# Patient Record
Sex: Female | Born: 1980
Health system: Southern US, Community
[De-identification: ages and names within clinical notes are randomized; demographics above are authoritative.]

## PROBLEM LIST (undated history)

## (undated) DIAGNOSIS — R87629 Unspecified abnormal cytological findings in specimens from vagina: Secondary | ICD-10-CM

## (undated) DIAGNOSIS — R519 Headache, unspecified: Secondary | ICD-10-CM

## (undated) DIAGNOSIS — A749 Chlamydial infection, unspecified: Secondary | ICD-10-CM

## (undated) DIAGNOSIS — J40 Bronchitis, not specified as acute or chronic: Secondary | ICD-10-CM

## (undated) DIAGNOSIS — J45909 Unspecified asthma, uncomplicated: Secondary | ICD-10-CM

## (undated) DIAGNOSIS — A549 Gonococcal infection, unspecified: Secondary | ICD-10-CM

## (undated) HISTORY — DX: Unspecified asthma, uncomplicated: J45.909

## (undated) HISTORY — DX: Headache, unspecified: R51.9

## (undated) HISTORY — DX: Bronchitis, not specified as acute or chronic: J40

## (undated) HISTORY — DX: Chlamydial infection, unspecified: A74.9

## (undated) HISTORY — DX: Unspecified abnormal cytological findings in specimens from vagina: R87.629

## (undated) HISTORY — DX: Gonococcal infection, unspecified: A54.9

## (undated) HISTORY — PX: OTHER SURGICAL HISTORY: SHX169

---

## 2005-09-14 ENCOUNTER — Other Ambulatory Visit: Admission: RE | Admit: 2005-09-14 | Discharge: 2005-09-14 | Payer: Self-pay | Admitting: Family Medicine

## 2005-09-28 ENCOUNTER — Encounter: Admission: RE | Admit: 2005-09-28 | Discharge: 2005-09-28 | Payer: Self-pay | Admitting: Family Medicine

## 2006-10-01 ENCOUNTER — Other Ambulatory Visit: Admission: RE | Admit: 2006-10-01 | Discharge: 2006-10-01 | Payer: Self-pay | Admitting: Family Medicine

## 2007-10-31 ENCOUNTER — Other Ambulatory Visit: Admission: RE | Admit: 2007-10-31 | Discharge: 2007-10-31 | Payer: Self-pay | Admitting: Family Medicine

## 2008-11-01 ENCOUNTER — Other Ambulatory Visit: Admission: RE | Admit: 2008-11-01 | Discharge: 2008-11-01 | Payer: Self-pay | Admitting: Family Medicine

## 2009-10-24 ENCOUNTER — Emergency Department (HOSPITAL_COMMUNITY)
Admission: EM | Admit: 2009-10-24 | Discharge: 2009-10-24 | Payer: Self-pay | Source: Home / Self Care | Admitting: Emergency Medicine

## 2009-11-22 ENCOUNTER — Other Ambulatory Visit: Admission: RE | Admit: 2009-11-22 | Discharge: 2009-11-22 | Payer: Self-pay | Admitting: Family Medicine
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
## 2010-12-25 ENCOUNTER — Other Ambulatory Visit: Payer: Self-pay | Admitting: Family Medicine

## 2010-12-25 ENCOUNTER — Other Ambulatory Visit (HOSPITAL_COMMUNITY)
Admission: RE | Admit: 2010-12-25 | Discharge: 2010-12-25 | Disposition: A | Discharge status: Home / Self Care | Payer: PRIVATE HEALTH INSURANCE | Source: Ambulatory Visit | Attending: Family Medicine | Admitting: Family Medicine

## 2010-12-25 DIAGNOSIS — Z124 Encounter for screening for malignant neoplasm of cervix: Secondary | ICD-10-CM | POA: Insufficient documentation

## 2011-01-04 LAB — DIFFERENTIAL
Eosinophils Absolute: 0.6 10*3/uL (ref 0.0–0.7)
Eosinophils Relative: 5 % (ref 0–5)
Lymphs Abs: 5.1 10*3/uL — ABNORMAL HIGH (ref 0.7–4.0)
Monocytes Relative: 7 % (ref 3–12)

## 2011-01-04 LAB — BASIC METABOLIC PANEL
BUN: 11 mg/dL (ref 6–23)
CO2: 23 mEq/L (ref 19–32)
Chloride: 106 mEq/L (ref 96–112)
Potassium: 3.3 mEq/L — ABNORMAL LOW (ref 3.5–5.1)

## 2011-01-04 LAB — CBC
HCT: 43 % (ref 36.0–46.0)
MCV: 88.8 fL (ref 78.0–100.0)
Platelets: 349 10*3/uL (ref 150–400)
RBC: 4.84 MIL/uL (ref 3.87–5.11)
WBC: 11.5 10*3/uL — ABNORMAL HIGH (ref 4.0–10.5)

## 2012-12-28 ENCOUNTER — Other Ambulatory Visit: Payer: Self-pay | Admitting: Family Medicine

## 2012-12-28 ENCOUNTER — Other Ambulatory Visit (HOSPITAL_COMMUNITY)
Admission: RE | Admit: 2012-12-28 | Discharge: 2012-12-28 | Disposition: A | Discharge status: Home / Self Care | Payer: BC Managed Care – PPO | Source: Ambulatory Visit | Attending: Family Medicine | Admitting: Family Medicine

## 2012-12-28 DIAGNOSIS — Z0000 Encounter for general adult medical examination without abnormal findings: Secondary | ICD-10-CM | POA: Insufficient documentation

## 2013-12-29 ENCOUNTER — Other Ambulatory Visit (HOSPITAL_COMMUNITY)
Admission: RE | Admit: 2013-12-29 | Discharge: 2013-12-29 | Disposition: A | Discharge status: Home / Self Care | Payer: 59 | Source: Ambulatory Visit | Attending: Family Medicine | Admitting: Family Medicine

## 2013-12-29 ENCOUNTER — Other Ambulatory Visit: Payer: Self-pay | Admitting: Family Medicine

## 2013-12-29 DIAGNOSIS — Z0000 Encounter for general adult medical examination without abnormal findings: Secondary | ICD-10-CM | POA: Insufficient documentation

## 2013-12-29 DIAGNOSIS — Z1151 Encounter for screening for human papillomavirus (HPV): Secondary | ICD-10-CM | POA: Insufficient documentation

## 2014-03-28 ENCOUNTER — Other Ambulatory Visit: Payer: Self-pay | Admitting: Family Medicine

## 2017-01-04 DIAGNOSIS — D2261 Melanocytic nevi of right upper limb, including shoulder: Secondary | ICD-10-CM | POA: Diagnosis not present

## 2017-01-04 DIAGNOSIS — D485 Neoplasm of uncertain behavior of skin: Secondary | ICD-10-CM | POA: Diagnosis not present

## 2017-01-04 DIAGNOSIS — D2271 Melanocytic nevi of right lower limb, including hip: Secondary | ICD-10-CM | POA: Diagnosis not present

## 2017-01-04 DIAGNOSIS — D225 Melanocytic nevi of trunk: Secondary | ICD-10-CM | POA: Diagnosis not present

## 2017-01-04 DIAGNOSIS — L723 Sebaceous cyst: Secondary | ICD-10-CM | POA: Diagnosis not present

## 2017-01-04 DIAGNOSIS — D2262 Melanocytic nevi of left upper limb, including shoulder: Secondary | ICD-10-CM | POA: Diagnosis not present

## 2017-02-02 ENCOUNTER — Other Ambulatory Visit: Payer: Self-pay | Admitting: Family Medicine

## 2017-02-02 ENCOUNTER — Other Ambulatory Visit (HOSPITAL_COMMUNITY)
Admission: RE | Admit: 2017-02-02 | Discharge: 2017-02-02 | Disposition: A | Discharge status: Home / Self Care | Payer: BLUE CROSS/BLUE SHIELD | Source: Ambulatory Visit | Attending: Family Medicine | Admitting: Family Medicine

## 2017-02-02 DIAGNOSIS — Z113 Encounter for screening for infections with a predominantly sexual mode of transmission: Secondary | ICD-10-CM | POA: Insufficient documentation

## 2017-02-02 DIAGNOSIS — Z124 Encounter for screening for malignant neoplasm of cervix: Secondary | ICD-10-CM | POA: Diagnosis not present

## 2017-02-02 DIAGNOSIS — Z0000 Encounter for general adult medical examination without abnormal findings: Secondary | ICD-10-CM | POA: Diagnosis not present

## 2017-02-02 DIAGNOSIS — M25562 Pain in left knee: Secondary | ICD-10-CM | POA: Diagnosis not present

## 2017-02-02 DIAGNOSIS — J4520 Mild intermittent asthma, uncomplicated: Secondary | ICD-10-CM | POA: Diagnosis not present

## 2017-02-04 LAB — CYTOLOGY - PAP
ADEQUACY: ABSENT
CHLAMYDIA, DNA PROBE: NEGATIVE
Diagnosis: NEGATIVE
NEISSERIA GONORRHEA: NEGATIVE

## 2017-02-05 DIAGNOSIS — Z0000 Encounter for general adult medical examination without abnormal findings: Secondary | ICD-10-CM | POA: Diagnosis not present

## 2017-02-05 DIAGNOSIS — Z131 Encounter for screening for diabetes mellitus: Secondary | ICD-10-CM | POA: Diagnosis not present

## 2017-02-05 DIAGNOSIS — Z113 Encounter for screening for infections with a predominantly sexual mode of transmission: Secondary | ICD-10-CM | POA: Diagnosis not present

## 2017-05-05 DIAGNOSIS — L509 Urticaria, unspecified: Secondary | ICD-10-CM | POA: Diagnosis not present

## 2017-07-29 DIAGNOSIS — H66002 Acute suppurative otitis media without spontaneous rupture of ear drum, left ear: Secondary | ICD-10-CM | POA: Diagnosis not present

## 2017-07-29 DIAGNOSIS — J4521 Mild intermittent asthma with (acute) exacerbation: Secondary | ICD-10-CM | POA: Diagnosis not present

## 2018-01-10 DIAGNOSIS — D224 Melanocytic nevi of scalp and neck: Secondary | ICD-10-CM | POA: Diagnosis not present

## 2018-01-10 DIAGNOSIS — L812 Freckles: Secondary | ICD-10-CM | POA: Diagnosis not present

## 2018-01-10 DIAGNOSIS — L720 Epidermal cyst: Secondary | ICD-10-CM | POA: Diagnosis not present

## 2018-01-10 DIAGNOSIS — L7211 Pilar cyst: Secondary | ICD-10-CM | POA: Diagnosis not present

## 2018-02-15 DIAGNOSIS — Z23 Encounter for immunization: Secondary | ICD-10-CM | POA: Diagnosis not present

## 2018-02-15 DIAGNOSIS — N644 Mastodynia: Secondary | ICD-10-CM | POA: Diagnosis not present

## 2018-02-15 DIAGNOSIS — Z3169 Encounter for other general counseling and advice on procreation: Secondary | ICD-10-CM | POA: Diagnosis not present

## 2018-02-15 DIAGNOSIS — J4520 Mild intermittent asthma, uncomplicated: Secondary | ICD-10-CM | POA: Diagnosis not present

## 2018-02-15 DIAGNOSIS — Z0001 Encounter for general adult medical examination with abnormal findings: Secondary | ICD-10-CM | POA: Diagnosis not present

## 2018-02-15 DIAGNOSIS — F17210 Nicotine dependence, cigarettes, uncomplicated: Secondary | ICD-10-CM | POA: Diagnosis not present

## 2018-02-16 ENCOUNTER — Other Ambulatory Visit: Payer: Self-pay | Admitting: Family Medicine

## 2018-02-16 DIAGNOSIS — N644 Mastodynia: Secondary | ICD-10-CM

## 2018-02-22 ENCOUNTER — Ambulatory Visit
Admission: RE | Admit: 2018-02-22 | Discharge: 2018-02-22 | Disposition: A | Discharge status: Home / Self Care | Payer: PRIVATE HEALTH INSURANCE | Source: Ambulatory Visit | Attending: Family Medicine | Admitting: Family Medicine

## 2018-02-22 ENCOUNTER — Ambulatory Visit
Admission: RE | Admit: 2018-02-22 | Discharge: 2018-02-22 | Disposition: A | Discharge status: Home / Self Care | Payer: BLUE CROSS/BLUE SHIELD | Source: Ambulatory Visit | Attending: Family Medicine | Admitting: Family Medicine

## 2018-02-22 DIAGNOSIS — N644 Mastodynia: Secondary | ICD-10-CM

## 2018-02-22 DIAGNOSIS — N6012 Diffuse cystic mastopathy of left breast: Secondary | ICD-10-CM | POA: Diagnosis not present

## 2018-02-22 DIAGNOSIS — R922 Inconclusive mammogram: Secondary | ICD-10-CM | POA: Diagnosis not present

## 2018-07-25 DIAGNOSIS — Z6826 Body mass index (BMI) 26.0-26.9, adult: Secondary | ICD-10-CM | POA: Diagnosis not present

## 2018-07-25 DIAGNOSIS — L247 Irritant contact dermatitis due to plants, except food: Secondary | ICD-10-CM | POA: Diagnosis not present

## 2019-03-22 DIAGNOSIS — D224 Melanocytic nevi of scalp and neck: Secondary | ICD-10-CM | POA: Diagnosis not present

## 2019-03-22 DIAGNOSIS — D2261 Melanocytic nevi of right upper limb, including shoulder: Secondary | ICD-10-CM | POA: Diagnosis not present

## 2019-03-22 DIAGNOSIS — D2362 Other benign neoplasm of skin of left upper limb, including shoulder: Secondary | ICD-10-CM | POA: Diagnosis not present

## 2019-03-22 DIAGNOSIS — D2361 Other benign neoplasm of skin of right upper limb, including shoulder: Secondary | ICD-10-CM | POA: Diagnosis not present

## 2019-03-27 DIAGNOSIS — F17210 Nicotine dependence, cigarettes, uncomplicated: Secondary | ICD-10-CM | POA: Diagnosis not present

## 2019-03-27 DIAGNOSIS — Z309 Encounter for contraceptive management, unspecified: Secondary | ICD-10-CM | POA: Diagnosis not present

## 2019-03-27 DIAGNOSIS — Z0000 Encounter for general adult medical examination without abnormal findings: Secondary | ICD-10-CM | POA: Diagnosis not present

## 2019-03-27 DIAGNOSIS — J4520 Mild intermittent asthma, uncomplicated: Secondary | ICD-10-CM | POA: Diagnosis not present

## 2019-03-27 DIAGNOSIS — J309 Allergic rhinitis, unspecified: Secondary | ICD-10-CM | POA: Diagnosis not present

## 2020-01-09 DIAGNOSIS — N912 Amenorrhea, unspecified: Secondary | ICD-10-CM | POA: Diagnosis not present

## 2020-04-02 ENCOUNTER — Other Ambulatory Visit (HOSPITAL_COMMUNITY)
Admission: RE | Admit: 2020-04-02 | Discharge: 2020-04-02 | Disposition: A | Discharge status: Home / Self Care | Payer: BC Managed Care – PPO | Source: Ambulatory Visit | Attending: Family Medicine | Admitting: Family Medicine

## 2020-04-02 ENCOUNTER — Other Ambulatory Visit: Payer: Self-pay | Admitting: Family Medicine

## 2020-04-02 DIAGNOSIS — Z124 Encounter for screening for malignant neoplasm of cervix: Secondary | ICD-10-CM | POA: Insufficient documentation

## 2020-04-02 DIAGNOSIS — Z3169 Encounter for other general counseling and advice on procreation: Secondary | ICD-10-CM | POA: Diagnosis not present

## 2020-04-02 DIAGNOSIS — F17210 Nicotine dependence, cigarettes, uncomplicated: Secondary | ICD-10-CM | POA: Diagnosis not present

## 2020-04-02 DIAGNOSIS — Z130 Encounter for screening for diseases of the blood and blood-forming organs and certain disorders involving the immune mechanism: Secondary | ICD-10-CM | POA: Diagnosis not present

## 2020-04-02 DIAGNOSIS — J309 Allergic rhinitis, unspecified: Secondary | ICD-10-CM | POA: Diagnosis not present

## 2020-04-02 DIAGNOSIS — L247 Irritant contact dermatitis due to plants, except food: Secondary | ICD-10-CM | POA: Diagnosis not present

## 2020-04-02 DIAGNOSIS — D2262 Melanocytic nevi of left upper limb, including shoulder: Secondary | ICD-10-CM | POA: Diagnosis not present

## 2020-04-02 DIAGNOSIS — B360 Pityriasis versicolor: Secondary | ICD-10-CM | POA: Diagnosis not present

## 2020-04-02 DIAGNOSIS — Z0000 Encounter for general adult medical examination without abnormal findings: Secondary | ICD-10-CM | POA: Diagnosis not present

## 2020-04-02 DIAGNOSIS — D225 Melanocytic nevi of trunk: Secondary | ICD-10-CM | POA: Diagnosis not present

## 2020-04-02 DIAGNOSIS — D2261 Melanocytic nevi of right upper limb, including shoulder: Secondary | ICD-10-CM | POA: Diagnosis not present

## 2020-04-02 DIAGNOSIS — J4520 Mild intermittent asthma, uncomplicated: Secondary | ICD-10-CM | POA: Diagnosis not present

## 2020-04-08 LAB — CYTOLOGY - PAP
Comment: NEGATIVE
Diagnosis: NEGATIVE
High risk HPV: NEGATIVE

## 2020-07-02 DIAGNOSIS — N898 Other specified noninflammatory disorders of vagina: Secondary | ICD-10-CM | POA: Diagnosis not present

## 2020-07-02 DIAGNOSIS — Z3A01 Less than 8 weeks gestation of pregnancy: Secondary | ICD-10-CM | POA: Diagnosis not present

## 2020-07-03 DIAGNOSIS — O0281 Inappropriate change in quantitative human chorionic gonadotropin (hCG) in early pregnancy: Secondary | ICD-10-CM | POA: Diagnosis not present

## 2020-07-03 DIAGNOSIS — O209 Hemorrhage in early pregnancy, unspecified: Secondary | ICD-10-CM | POA: Diagnosis not present

## 2020-07-15 DIAGNOSIS — O0281 Inappropriate change in quantitative human chorionic gonadotropin (hCG) in early pregnancy: Secondary | ICD-10-CM | POA: Diagnosis not present

## 2020-09-24 DIAGNOSIS — O09521 Supervision of elderly multigravida, first trimester: Secondary | ICD-10-CM | POA: Diagnosis not present

## 2020-09-24 DIAGNOSIS — Z3201 Encounter for pregnancy test, result positive: Secondary | ICD-10-CM | POA: Diagnosis not present

## 2020-09-24 DIAGNOSIS — Z3480 Encounter for supervision of other normal pregnancy, unspecified trimester: Secondary | ICD-10-CM | POA: Diagnosis not present

## 2020-09-24 DIAGNOSIS — O09291 Supervision of pregnancy with other poor reproductive or obstetric history, first trimester: Secondary | ICD-10-CM | POA: Diagnosis not present

## 2020-10-07 DIAGNOSIS — Z3490 Encounter for supervision of normal pregnancy, unspecified, unspecified trimester: Secondary | ICD-10-CM | POA: Diagnosis not present

## 2020-10-19 NOTE — L&D Delivery Note (Signed)
Delivery Note Arrived to room to evaluate patient status. Cervix completely dilated with Twin A in vaginal canal. Counseled patient regarding indication to initiate pushing. Twin A delivered in cephalic position and fetal movement and attempted breathing noted. Cord was clamped and cut and placed on maternal abdomen and then chest for skin-to-skin. Twin B delivered en caul and fetal movement was noted. Cord was clamped and cut and placed on maternal chest for skin-to-skin. Pitocin bolus started. With a maternal push, the placenta delivered intact. The cervix, vagina, labia and perineum were inspected and no lacerations were found. Fundus firm and below umbilicus. All counts were correct.   Anesthesia: Epidural Episiotomy:  N/A Lacerations:  None Suture Repair:  None Est. Blood Loss (mL):  400  Steva Ready 07/30/2021, 7:49 AM

## 2020-10-29 DIAGNOSIS — O039 Complete or unspecified spontaneous abortion without complication: Secondary | ICD-10-CM | POA: Diagnosis not present

## 2021-03-26 DIAGNOSIS — Z79899 Other long term (current) drug therapy: Secondary | ICD-10-CM | POA: Diagnosis not present

## 2021-03-26 DIAGNOSIS — F17200 Nicotine dependence, unspecified, uncomplicated: Secondary | ICD-10-CM | POA: Diagnosis not present

## 2021-03-26 DIAGNOSIS — R112 Nausea with vomiting, unspecified: Secondary | ICD-10-CM | POA: Diagnosis not present

## 2021-03-26 DIAGNOSIS — O26891 Other specified pregnancy related conditions, first trimester: Secondary | ICD-10-CM | POA: Diagnosis not present

## 2021-03-26 DIAGNOSIS — Z331 Pregnant state, incidental: Secondary | ICD-10-CM | POA: Diagnosis not present

## 2021-03-26 DIAGNOSIS — R109 Unspecified abdominal pain: Secondary | ICD-10-CM | POA: Diagnosis not present

## 2021-03-26 DIAGNOSIS — Z3A01 Less than 8 weeks gestation of pregnancy: Secondary | ICD-10-CM | POA: Diagnosis not present

## 2021-04-01 DIAGNOSIS — O26899 Other specified pregnancy related conditions, unspecified trimester: Secondary | ICD-10-CM | POA: Diagnosis not present

## 2021-04-01 DIAGNOSIS — O09521 Supervision of elderly multigravida, first trimester: Secondary | ICD-10-CM | POA: Diagnosis not present

## 2021-04-01 DIAGNOSIS — O09291 Supervision of pregnancy with other poor reproductive or obstetric history, first trimester: Secondary | ICD-10-CM | POA: Diagnosis not present

## 2021-04-02 DIAGNOSIS — D2261 Melanocytic nevi of right upper limb, including shoulder: Secondary | ICD-10-CM | POA: Diagnosis not present

## 2021-04-02 DIAGNOSIS — D2262 Melanocytic nevi of left upper limb, including shoulder: Secondary | ICD-10-CM | POA: Diagnosis not present

## 2021-04-02 DIAGNOSIS — D224 Melanocytic nevi of scalp and neck: Secondary | ICD-10-CM | POA: Diagnosis not present

## 2021-04-02 DIAGNOSIS — D2362 Other benign neoplasm of skin of left upper limb, including shoulder: Secondary | ICD-10-CM | POA: Diagnosis not present

## 2021-04-03 DIAGNOSIS — O021 Missed abortion: Secondary | ICD-10-CM | POA: Diagnosis not present

## 2021-04-07 DIAGNOSIS — Z3480 Encounter for supervision of other normal pregnancy, unspecified trimester: Secondary | ICD-10-CM | POA: Diagnosis not present

## 2021-04-11 DIAGNOSIS — J4520 Mild intermittent asthma, uncomplicated: Secondary | ICD-10-CM | POA: Diagnosis not present

## 2021-04-11 DIAGNOSIS — L247 Irritant contact dermatitis due to plants, except food: Secondary | ICD-10-CM | POA: Diagnosis not present

## 2021-04-11 DIAGNOSIS — Z0000 Encounter for general adult medical examination without abnormal findings: Secondary | ICD-10-CM | POA: Diagnosis not present

## 2021-04-17 DIAGNOSIS — O09291 Supervision of pregnancy with other poor reproductive or obstetric history, first trimester: Secondary | ICD-10-CM | POA: Diagnosis not present

## 2021-04-17 DIAGNOSIS — O26899 Other specified pregnancy related conditions, unspecified trimester: Secondary | ICD-10-CM | POA: Diagnosis not present

## 2021-04-25 DIAGNOSIS — Z3480 Encounter for supervision of other normal pregnancy, unspecified trimester: Secondary | ICD-10-CM | POA: Diagnosis not present

## 2021-04-28 LAB — OB RESULTS CONSOLE GC/CHLAMYDIA
Chlamydia: NEGATIVE
Gonorrhea: NEGATIVE

## 2021-04-28 LAB — OB RESULTS CONSOLE RPR: RPR: NONREACTIVE

## 2021-04-28 LAB — OB RESULTS CONSOLE HIV ANTIBODY (ROUTINE TESTING): HIV: NONREACTIVE

## 2021-04-28 LAB — OB RESULTS CONSOLE RUBELLA ANTIBODY, IGM: Rubella: IMMUNE

## 2021-04-28 LAB — HEPATITIS C ANTIBODY: HCV Ab: NEGATIVE

## 2021-04-28 LAB — OB RESULTS CONSOLE HEPATITIS B SURFACE ANTIGEN: Hepatitis B Surface Ag: NEGATIVE

## 2021-05-01 DIAGNOSIS — Z3481 Encounter for supervision of other normal pregnancy, first trimester: Secondary | ICD-10-CM | POA: Diagnosis not present

## 2021-05-15 ENCOUNTER — Encounter: Payer: Self-pay | Admitting: Obstetrics and Gynecology

## 2021-05-15 DIAGNOSIS — O09521 Supervision of elderly multigravida, first trimester: Secondary | ICD-10-CM | POA: Diagnosis not present

## 2021-05-15 DIAGNOSIS — O30009 Twin pregnancy, unspecified number of placenta and unspecified number of amniotic sacs, unspecified trimester: Secondary | ICD-10-CM | POA: Diagnosis not present

## 2021-05-20 ENCOUNTER — Encounter: Payer: Self-pay | Admitting: *Deleted

## 2021-05-22 ENCOUNTER — Other Ambulatory Visit: Payer: Self-pay | Admitting: Obstetrics and Gynecology

## 2021-05-22 DIAGNOSIS — Z3A12 12 weeks gestation of pregnancy: Secondary | ICD-10-CM

## 2021-05-22 DIAGNOSIS — O30009 Twin pregnancy, unspecified number of placenta and unspecified number of amniotic sacs, unspecified trimester: Secondary | ICD-10-CM

## 2021-05-23 ENCOUNTER — Encounter: Payer: Self-pay | Admitting: *Deleted

## 2021-05-23 ENCOUNTER — Ambulatory Visit (HOSPITAL_BASED_OUTPATIENT_CLINIC_OR_DEPARTMENT_OTHER): Payer: BC Managed Care – PPO | Admitting: Obstetrics

## 2021-05-23 ENCOUNTER — Ambulatory Visit: Payer: BC Managed Care – PPO | Attending: Obstetrics and Gynecology | Admitting: *Deleted

## 2021-05-23 ENCOUNTER — Ambulatory Visit (HOSPITAL_BASED_OUTPATIENT_CLINIC_OR_DEPARTMENT_OTHER): Payer: BC Managed Care – PPO

## 2021-05-23 ENCOUNTER — Other Ambulatory Visit: Payer: Self-pay

## 2021-05-23 ENCOUNTER — Other Ambulatory Visit: Payer: Self-pay | Admitting: *Deleted

## 2021-05-23 ENCOUNTER — Ambulatory Visit: Payer: BC Managed Care – PPO

## 2021-05-23 ENCOUNTER — Other Ambulatory Visit: Payer: Self-pay | Admitting: Obstetrics and Gynecology

## 2021-05-23 VITALS — BP 118/56 | HR 91 | Ht 63.0 in

## 2021-05-23 DIAGNOSIS — O30009 Twin pregnancy, unspecified number of placenta and unspecified number of amniotic sacs, unspecified trimester: Secondary | ICD-10-CM | POA: Diagnosis not present

## 2021-05-23 DIAGNOSIS — O30031 Twin pregnancy, monochorionic/diamniotic, first trimester: Secondary | ICD-10-CM | POA: Diagnosis not present

## 2021-05-23 DIAGNOSIS — Z3A12 12 weeks gestation of pregnancy: Secondary | ICD-10-CM | POA: Insufficient documentation

## 2021-05-23 DIAGNOSIS — O09521 Supervision of elderly multigravida, first trimester: Secondary | ICD-10-CM | POA: Diagnosis not present

## 2021-05-23 DIAGNOSIS — O30039 Twin pregnancy, monochorionic/diamniotic, unspecified trimester: Secondary | ICD-10-CM

## 2021-05-23 NOTE — Progress Notes (Signed)
MFM Note  Deanna Pena was seen due to a spontaneously conceived twin pregnancy and advanced maternal age.  She denies any significant past medical history and denies any problems in her current pregnancy.  She has not had any screening tests for fetal aneuploidy drawn in her current pregnancy.  A thin dividing membrane was noted separating the two fetuses along with a single placenta, indicating that these are monochorionic, diamniotic twins.  This finding was confirmed using transvaginal ultrasound today.  The crown-rump lengths for both twin A and twin B measured appropriate for her gestational age.  There was normal amniotic fluid noted around both fetuses.  The nuchal translucency's for both fetuses appeared within normal limits.  The placenta appeared close to the internal cervical os today.  She was reassured that as she is very early in her pregnancy, the placenta will most likely move away from the cervix later in her pregnancy.  The implications and management of monochorionic twins was discussed. The 10% to 15% risk of twin to twin transfusion syndrome seen in monochorionic, diamniotic twins was discussed today.  The implications and management of twin to twin transfusion syndrome (TTTS) should she develop this complication was also discussed.  She was advised that we will continue to follow her closely with serial ultrasounds to assess for signs of TTTS.  She was advised that management of twin pregnancies will involve frequent ultrasound exams to assess the fetal growth and amniotic fluid level.  We will continue to follow her with ultrasounds once every 2 weeks to assess for signs of the twin to twin transfusion syndrome starting at 16 weeks. Weekly fetal testing should be started at around 32 weeks.  Delivery for uncomplicated monochorionic twins is recommended at around 37 weeks.  The increased risk of preeclampsia, gestational diabetes, and preterm birth/labor associated with twin  pregnancies was discussed.  She was advised that we will continue to follow her closely to assess for these conditions. As pregnancies with multiple gestations are at increased risk for developing preeclampsia, she was advised to start taking up to 2 tablets of baby aspirin (81 mg) per day to decrease her risk of developing preeclampsia as soon as possible.  There is more recent evidence to indicate that a higher dose of aspirin daily may be better at preventing preeclampsia in twins.  Due to advanced maternal age, the patient had the [employer] Panorama cell free DNA test drawn following today's ultrasound exam to screen for fetal aneuploidy.  The Panorama cell free DNA test will also help determine the zygosity of the twin gestation.  A follow-up ultrasound exam was scheduled in 4 weeks to screen for twin-twin transfusion syndrome.  A detailed fetal anatomy scan has also been scheduled in our office at around 18 to 19 weeks.  We will refer her to pediatric cardiology for a fetal echocardiogram later in her pregnancy.  The patient stated that all of her questions have been answered to her complete satisfaction.  A total of 35 minutes was spent counseling and coordinating the care for this patient.  Greater than 50% of the time was spent in direct face-to-face contact.  Recommendations:  Take up to 2 tablets of baby aspirin (81 mg) for preeclampsia prophylaxis Start ultrasounds once every 2 weeks at 16 weeks to screen for twin to twin transfusion syndrome Detailed fetal anatomy scan at 18 to 19 weeks Fetal echocardiogram at around 20 to 22 weeks Weekly fetal testing to be started at 32 weeks  Delivery by  37 weeks

## 2021-05-26 ENCOUNTER — Other Ambulatory Visit: Payer: Self-pay | Admitting: Obstetrics and Gynecology

## 2021-05-26 DIAGNOSIS — O30042 Twin pregnancy, dichorionic/diamniotic, second trimester: Secondary | ICD-10-CM

## 2021-05-26 DIAGNOSIS — Z3A19 19 weeks gestation of pregnancy: Secondary | ICD-10-CM

## 2021-05-26 DIAGNOSIS — Z363 Encounter for antenatal screening for malformations: Secondary | ICD-10-CM

## 2021-06-04 ENCOUNTER — Telehealth: Payer: Self-pay | Admitting: Obstetrics and Gynecology

## 2021-06-04 NOTE — Telephone Encounter (Signed)
  We spoke with Deanna Pena to review the results of Panorama NIPS through the laboratory Deanna Pena that was low-risk for fetal aneuploidies. We reviewed that these results showed a less than 1 in 10,000 risk for trisomies 21, 18 and 13, and monosomy X (Turner syndrome). The patient declined to know fetal gender today, and would like it in an envelope at her next visit.    Zygosity testing showed that this is a monozygotic twin pregnancy (or identical).  Deanna Pena elected to have cfDNA analysis for 22q11.2 deletion syndrome, which was also low risk (1 in 12,000).   We reviewed that while this testing identifies 94-99% of pregnancies with trisomy 51, trisomy 67, and trisomy 7, and >70% of cases of sex chromosome aneuploidies, it is NOT diagnostic. A positive test result requires confirmation by CVS or amniocentesis, and a negative test result does not rule out a fetal chromosome abnormality. She also understands that this testing does not identify all genetic conditions.  If screening for open neural tube defects is desired, we would recommend a maternal serum AFP only in the second trimester to be drawn through her OB.  We may be reached at [phone] with any questions.  Cherly Anderson, MS, CGC

## 2021-06-11 ENCOUNTER — Other Ambulatory Visit: Payer: Self-pay

## 2021-06-20 ENCOUNTER — Other Ambulatory Visit: Payer: Self-pay

## 2021-06-20 ENCOUNTER — Ambulatory Visit: Payer: BC Managed Care – PPO | Admitting: *Deleted

## 2021-06-20 ENCOUNTER — Other Ambulatory Visit: Payer: Self-pay | Admitting: *Deleted

## 2021-06-20 ENCOUNTER — Other Ambulatory Visit: Payer: Self-pay | Admitting: Obstetrics

## 2021-06-20 ENCOUNTER — Ambulatory Visit: Payer: BC Managed Care – PPO | Attending: Obstetrics and Gynecology

## 2021-06-20 ENCOUNTER — Encounter: Payer: Self-pay | Admitting: *Deleted

## 2021-06-20 VITALS — BP 120/61 | HR 96

## 2021-06-20 DIAGNOSIS — O30039 Twin pregnancy, monochorionic/diamniotic, unspecified trimester: Secondary | ICD-10-CM | POA: Insufficient documentation

## 2021-06-20 DIAGNOSIS — O09522 Supervision of elderly multigravida, second trimester: Secondary | ICD-10-CM | POA: Diagnosis not present

## 2021-06-20 DIAGNOSIS — O30032 Twin pregnancy, monochorionic/diamniotic, second trimester: Secondary | ICD-10-CM | POA: Diagnosis not present

## 2021-06-20 DIAGNOSIS — Z3A16 16 weeks gestation of pregnancy: Secondary | ICD-10-CM | POA: Diagnosis not present

## 2021-06-30 DIAGNOSIS — Z3490 Encounter for supervision of normal pregnancy, unspecified, unspecified trimester: Secondary | ICD-10-CM | POA: Diagnosis not present

## 2021-06-30 DIAGNOSIS — Z3482 Encounter for supervision of other normal pregnancy, second trimester: Secondary | ICD-10-CM | POA: Diagnosis not present

## 2021-07-04 ENCOUNTER — Encounter: Payer: Self-pay | Admitting: *Deleted

## 2021-07-04 ENCOUNTER — Ambulatory Visit: Payer: BC Managed Care – PPO | Attending: Obstetrics and Gynecology

## 2021-07-04 ENCOUNTER — Ambulatory Visit: Payer: BC Managed Care – PPO

## 2021-07-04 ENCOUNTER — Other Ambulatory Visit: Payer: Self-pay | Admitting: *Deleted

## 2021-07-04 ENCOUNTER — Other Ambulatory Visit: Payer: Self-pay

## 2021-07-04 ENCOUNTER — Ambulatory Visit: Payer: BC Managed Care – PPO | Admitting: *Deleted

## 2021-07-04 VITALS — BP 125/62 | HR 89

## 2021-07-04 DIAGNOSIS — Z363 Encounter for antenatal screening for malformations: Secondary | ICD-10-CM | POA: Diagnosis not present

## 2021-07-04 DIAGNOSIS — O30042 Twin pregnancy, dichorionic/diamniotic, second trimester: Secondary | ICD-10-CM

## 2021-07-04 DIAGNOSIS — Z3A19 19 weeks gestation of pregnancy: Secondary | ICD-10-CM | POA: Diagnosis not present

## 2021-07-04 DIAGNOSIS — O09522 Supervision of elderly multigravida, second trimester: Secondary | ICD-10-CM

## 2021-07-04 DIAGNOSIS — O4440 Low lying placenta NOS or without hemorrhage, unspecified trimester: Secondary | ICD-10-CM

## 2021-07-04 DIAGNOSIS — O30039 Twin pregnancy, monochorionic/diamniotic, unspecified trimester: Secondary | ICD-10-CM

## 2021-07-09 ENCOUNTER — Ambulatory Visit: Payer: BC Managed Care – PPO

## 2021-07-18 ENCOUNTER — Ambulatory Visit: Payer: BC Managed Care – PPO | Attending: Obstetrics and Gynecology

## 2021-07-18 ENCOUNTER — Other Ambulatory Visit: Payer: Self-pay

## 2021-07-18 ENCOUNTER — Ambulatory Visit: Payer: BC Managed Care – PPO | Admitting: *Deleted

## 2021-07-18 ENCOUNTER — Encounter: Payer: Self-pay | Admitting: *Deleted

## 2021-07-18 VITALS — BP 126/62 | HR 97

## 2021-07-18 DIAGNOSIS — Z3A20 20 weeks gestation of pregnancy: Secondary | ICD-10-CM

## 2021-07-18 DIAGNOSIS — O30039 Twin pregnancy, monochorionic/diamniotic, unspecified trimester: Secondary | ICD-10-CM

## 2021-07-18 DIAGNOSIS — O30032 Twin pregnancy, monochorionic/diamniotic, second trimester: Secondary | ICD-10-CM | POA: Diagnosis not present

## 2021-07-18 DIAGNOSIS — O09522 Supervision of elderly multigravida, second trimester: Secondary | ICD-10-CM | POA: Diagnosis not present

## 2021-07-28 ENCOUNTER — Telehealth: Payer: Self-pay

## 2021-07-28 NOTE — Telephone Encounter (Signed)
Patient is scheduled for Fetal Echo at [facility] on 08/15/2021 at 1:00pm.

## 2021-07-29 ENCOUNTER — Inpatient Hospital Stay (HOSPITAL_BASED_OUTPATIENT_CLINIC_OR_DEPARTMENT_OTHER): Payer: BC Managed Care – PPO

## 2021-07-29 ENCOUNTER — Inpatient Hospital Stay (HOSPITAL_COMMUNITY)
Admission: AD | Admit: 2021-07-29 | Discharge: 2021-07-31 | DRG: 805 | Disposition: A | Discharge status: Home / Self Care | Payer: BC Managed Care – PPO | Attending: Obstetrics and Gynecology | Admitting: Obstetrics and Gynecology

## 2021-07-29 ENCOUNTER — Inpatient Hospital Stay (HOSPITAL_COMMUNITY): Payer: BC Managed Care – PPO | Admitting: Anesthesiology

## 2021-07-29 ENCOUNTER — Other Ambulatory Visit: Payer: Self-pay

## 2021-07-29 DIAGNOSIS — O09522 Supervision of elderly multigravida, second trimester: Secondary | ICD-10-CM

## 2021-07-29 DIAGNOSIS — O30039 Twin pregnancy, monochorionic/diamniotic, unspecified trimester: Secondary | ICD-10-CM | POA: Diagnosis not present

## 2021-07-29 DIAGNOSIS — O30002 Twin pregnancy, unspecified number of placenta and unspecified number of amniotic sacs, second trimester: Secondary | ICD-10-CM | POA: Diagnosis not present

## 2021-07-29 DIAGNOSIS — R1030 Lower abdominal pain, unspecified: Secondary | ICD-10-CM | POA: Diagnosis not present

## 2021-07-29 DIAGNOSIS — O30032 Twin pregnancy, monochorionic/diamniotic, second trimester: Secondary | ICD-10-CM | POA: Diagnosis present

## 2021-07-29 DIAGNOSIS — O321XX2 Maternal care for breech presentation, fetus 2: Secondary | ICD-10-CM | POA: Diagnosis not present

## 2021-07-29 DIAGNOSIS — O9902 Anemia complicating childbirth: Secondary | ICD-10-CM | POA: Diagnosis not present

## 2021-07-29 DIAGNOSIS — Z20822 Contact with and (suspected) exposure to covid-19: Secondary | ICD-10-CM | POA: Diagnosis present

## 2021-07-29 DIAGNOSIS — D649 Anemia, unspecified: Secondary | ICD-10-CM | POA: Diagnosis not present

## 2021-07-29 DIAGNOSIS — O26812 Pregnancy related exhaustion and fatigue, second trimester: Secondary | ICD-10-CM | POA: Diagnosis not present

## 2021-07-29 DIAGNOSIS — O42919 Preterm premature rupture of membranes, unspecified as to length of time between rupture and onset of labor, unspecified trimester: Secondary | ICD-10-CM | POA: Diagnosis present

## 2021-07-29 DIAGNOSIS — Z3A22 22 weeks gestation of pregnancy: Secondary | ICD-10-CM | POA: Diagnosis not present

## 2021-07-29 DIAGNOSIS — O42012 Preterm premature rupture of membranes, onset of labor within 24 hours of rupture, second trimester: Secondary | ICD-10-CM | POA: Diagnosis not present

## 2021-07-29 DIAGNOSIS — O42912 Preterm premature rupture of membranes, unspecified as to length of time between rupture and onset of labor, second trimester: Secondary | ICD-10-CM | POA: Diagnosis not present

## 2021-07-29 DIAGNOSIS — O43892 Other placental disorders, second trimester: Secondary | ICD-10-CM | POA: Diagnosis not present

## 2021-07-29 LAB — CBC
HCT: 30.7 % — ABNORMAL LOW (ref 36.0–46.0)
Hemoglobin: 10.5 g/dL — ABNORMAL LOW (ref 12.0–15.0)
MCH: 29.8 pg (ref 26.0–34.0)
MCHC: 34.2 g/dL (ref 30.0–36.0)
MCV: 87.2 fL (ref 80.0–100.0)
Platelets: 273 10*3/uL (ref 150–400)
RBC: 3.52 MIL/uL — ABNORMAL LOW (ref 3.87–5.11)
RDW: 12.9 % (ref 11.5–15.5)
WBC: 20.9 10*3/uL — ABNORMAL HIGH (ref 4.0–10.5)
nRBC: 0 % (ref 0.0–0.2)

## 2021-07-29 LAB — RESP PANEL BY RT-PCR (FLU A&B, COVID) ARPGX2
Influenza A by PCR: NEGATIVE
Influenza B by PCR: NEGATIVE
SARS Coronavirus 2 by RT PCR: NEGATIVE

## 2021-07-29 LAB — TYPE AND SCREEN
ABO/RH(D): A POS
Antibody Screen: NEGATIVE

## 2021-07-29 LAB — POCT FERN TEST: POCT Fern Test: POSITIVE

## 2021-07-29 MED ORDER — SODIUM CHLORIDE 0.9 % IV SOLN
2.0000 g | Freq: Four times a day (QID) | INTRAVENOUS | Status: DC
  Administered 2021-07-29 – 2021-07-30 (×3): 2 g via INTRAVENOUS
  Filled 2021-07-29 (×3): qty 2000

## 2021-07-29 MED ORDER — BETAMETHASONE SOD PHOS & ACET 6 (3-3) MG/ML IJ SUSP
12.0000 mg | INTRAMUSCULAR | Status: DC
  Administered 2021-07-29: 12 mg via INTRAMUSCULAR
  Filled 2021-07-29: qty 5

## 2021-07-29 MED ORDER — LIDOCAINE HCL (PF) 1 % IJ SOLN
INTRAMUSCULAR | Status: DC | PRN
  Administered 2021-07-29: 2 mL via EPIDURAL
  Administered 2021-07-29: 10 mL via EPIDURAL

## 2021-07-29 MED ORDER — LACTATED RINGERS IV SOLN
500.0000 mL | Freq: Once | INTRAVENOUS | Status: AC
  Administered 2021-07-29: 500 mL via INTRAVENOUS

## 2021-07-29 MED ORDER — DIPHENHYDRAMINE HCL 50 MG/ML IJ SOLN: 12.5000 mg | INTRAMUSCULAR | Status: DC | PRN

## 2021-07-29 MED ORDER — FENTANYL CITRATE (PF) 100 MCG/2ML IJ SOLN
INTRAMUSCULAR | Status: AC
  Administered 2021-07-29: 100 ug via INTRAVENOUS
  Filled 2021-07-29: qty 2

## 2021-07-29 MED ORDER — FENTANYL CITRATE (PF) 100 MCG/2ML IJ SOLN
100.0000 ug | INTRAMUSCULAR | Status: DC | PRN
Start: 2021-07-29 — End: 2021-07-30

## 2021-07-29 MED ORDER — SODIUM CHLORIDE 0.9 % IV SOLN: 1.0000 g | Freq: Four times a day (QID) | INTRAVENOUS | Status: DC

## 2021-07-29 MED ORDER — FENTANYL-BUPIVACAINE-NACL 0.5-0.125-0.9 MG/250ML-% EP SOLN
EPIDURAL | Status: DC | PRN
  Administered 2021-07-29: 12 mL/h via EPIDURAL

## 2021-07-29 MED ORDER — PHENYLEPHRINE 40 MCG/ML (10ML) SYRINGE FOR IV PUSH (FOR BLOOD PRESSURE SUPPORT): 80.0000 ug | PREFILLED_SYRINGE | INTRAVENOUS | Status: DC | PRN

## 2021-07-29 MED ORDER — EPHEDRINE 5 MG/ML INJ: 10.0000 mg | INTRAVENOUS | Status: DC | PRN

## 2021-07-29 MED ORDER — AZITHROMYCIN 500 MG PO TABS
1000.0000 mg | ORAL_TABLET | Freq: Once | ORAL | Status: AC
  Administered 2021-07-29: 1000 mg via ORAL
  Filled 2021-07-29 (×2): qty 2

## 2021-07-29 MED ORDER — FENTANYL-BUPIVACAINE-NACL 0.5-0.125-0.9 MG/250ML-% EP SOLN
12.0000 mL/h | EPIDURAL | Status: DC | PRN
  Administered 2021-07-30: 12 mL/h via EPIDURAL
  Filled 2021-07-29 (×2): qty 250

## 2021-07-29 NOTE — Consult Note (Signed)
Consultation Service: Neonatology   Dr. Delora Fuel has asked for consultation on Ms. Strauch regarding the potential care of a premature mo-di twin infants at [redacted]w[redacted]d. Thank you for inviting [place] to see this patient.   Chief complaint: 40 yo female with a mo-di twin IUP at [redacted]w[redacted]d with onset of labor and PROM of twin A.   My key findings of this patient's HPI are:  I have reviewed the patient's chart and have met with her. The salient information is as follows:   Prenatal labs: Blood type:  A Positive Antibody screen:  Negative CBC:  H/H 13/38 Rubella: Immune RPR:  Non-reactive Hep B:  Negative Hep C:  Negative HIV:  Negative GC/CT:  Negative Glucola:  Not performed yet   Prenatal care:   good Pregnancy complications:  multiple gestation, preterm labor, PROM twin A Maternal antibiotics:   ampicillin Maternal Steroids: BMX x 1 07/29/2021 Ultrasound: results from today pending, last 9/30, weight not recorded, fetus A measured at [redacted]w[redacted]d, fetus B [redacted]w[redacted]d   My recommendations for this patient and my actions included:   1. In the presence of the patient and her partner, I spent 30 minutes discussing that currently at this center resuscitation is not offered prior to [redacted]w[redacted]d. Prior to [redacted]w[redacted]d, few centers are offering resuscitation but that this is on a per case basis. Mother enquired about these centers and the closest to this institution include [facility], [place], and [place]. Several factors in form this course of action including stability and safety of transportation with active labor at this time, transportation ability and availability, and discussion with outside center on feasibility of this course of action, acceptance of transfer and bed availability.   2. Further I emphasized the likely complications and outcomes of prematurity at 18-[redacted] weeks gestational age.I discussed the potential need for resuscitation at birth, mechanical ventilation and surfactant administration for respiratory distress, IV  fluids pending establishment of enteral feeds (encouraged breast milk feeding to which she planned to do), antibiotics for possible sepsis, temperature support, and monitoring. I also discussed the potential risk of complications such as intracranial hemorrhage. I also discussed the potential length of stay in the neonatal intensive care unit for about 4-6 months. I discussed this with parents in detail and they expressed an understanding of the risks and complications of prematurity.   3. We also discussed the expected survival of twin infants born at 51 weeks which is near 10-20% depending on gestational age, multiple gestation, weight and completion of antenatal steroids. We further discussed that roughly 90% of neonates born at this age have severe neurological complications. She expressed an understanding of this information.   4. I informed her that at this time we would not be able to offer resuscitation but that after [redacted]w[redacted]d the NICU team could be present at the delivery and proceed with resuscitation if the family would like to proceed.   At this time the family has not decided on the course of action that is best for their family. I am immediately available for any further discussion with this family as they proceed to make this difficult decision.    Davonna Belling, MD Attending Neonatologist

## 2021-07-29 NOTE — MAU Provider Note (Signed)
History     CSN: [id number]  Arrival date and time: 07/29/21 1112   None     Chief Complaint  Patient presents with   Back Pain   HPI Deanna Pena is a 40 y.o. G3P0020 at [redacted]w[redacted]d with Mo/Di twins. She presents to MAU from [facility] with chief complaints of back pain and leaking of fluid. Pain began yesterday. Leaking of fluid started in the 10 o clock hour this morning. Pain score on arrival to MAU is 3/10. Patient is unable to walk during moments of pain. She is unable to speak during episodes of pain. Occurences are about 2-5 min apart. She denies vaginal bleeding, abdominal tenderness, fever.  Patient receives care with [facility].  OB History     Gravida  3   Para  0   Term  0   Preterm  0   AB  2   Living  0      SAB  2   IAB  0   Ectopic  0   Multiple      Live Births              Past Medical History:  Diagnosis Date   Asthma    Bronchitis    Chlamydia    Gonorrhea    Headache    Vaginal Pap smear, abnormal     Past Surgical History:  Procedure Laterality Date   Surgery on both arms requiring plates and screws Bilateral     Family History  Problem Relation Age of Onset   Heart disease Father    Diabetes Maternal Aunt    Diabetes Paternal Aunt    Hypertension Paternal Uncle    Heart disease Maternal Grandfather    Asthma Paternal Grandmother     Social History   Tobacco Use   Smoking status: Every Day    Types: Cigarettes   Smokeless tobacco: Never  Vaping Use   Vaping Use: Never used  Substance Use Topics   Alcohol use: Not Currently   Drug use: Never    Allergies: No Known Allergies  Medications Prior to Admission  Medication Sig Dispense Refill Last Dose   aspirin EC 81 MG tablet Take 162 mg by mouth daily. Swallow whole.   07/29/2021   Budesonide-Formoterol Fumarate (SYMBICORT IN) Inhale 1 puff into the lungs daily.   07/29/2021   cetirizine (ZYRTEC) 10 MG tablet Take 10 mg by mouth daily.   07/29/2021   Prenatal Vit-Fe  Fumarate-FA (PRENATAL MULTIVITAMIN) TABS tablet Take 1 tablet by mouth daily at 12 noon.   07/29/2021    Review of Systems  Gastrointestinal:  Positive for abdominal pain.  Genitourinary:  Positive for pelvic pain and vaginal discharge.  Musculoskeletal:  Positive for back pain.  All other systems reviewed and are negative. Physical Exam   Blood pressure 132/85, pulse (!) 118, temperature 98.8 F (37.1 C), temperature source Oral, resp. rate 20, last menstrual period 02/16/2021, SpO2 100 %.  Physical Exam Vitals and nursing note reviewed. Exam conducted with a chaperone present.  Constitutional:      General: She is in acute distress.     Appearance: Normal appearance.  Cardiovascular:     Rate and Rhythm: Tachycardia present.     Pulses: Normal pulses.  Pulmonary:     Effort: Pulmonary effort is normal.  Abdominal:     Comments: Gravid  Genitourinary:    Comments: Sterile speculum exam: grossly ruptured. Cervix visibly dilated 1-2 cm. Dark mass concerning for fetal  head or possible cord visualized in SSE. Skin:    Capillary Refill: Capillary refill takes less than 2 seconds.  Neurological:     Mental Status: She is alert and oriented to person, place, and time.    MAU Course/MDM  Procedures  --Reassuring fetal surveillance, Baby A Cephalic on MFM bedside scan --Grossly ruptured, vaginal vault filled with amniotic fluid, fern positive --Cervix visibly dilated. Will notify Dr. Connye Burkitt, NICU, L&D --Dr. Connye Burkitt in office, Dr. Adrian Blackwater notified in case standby is needed for delivery  Patient Vitals for the past 24 hrs:  BP Temp Temp src Pulse Resp SpO2  07/29/21 1236 -- 98 F (36.7 C) Oral -- -- --  07/29/21 1208 132/85 98.8 F (37.1 C) Oral (!) 118 20 100 %  07/29/21 1116 132/88 99 F (37.2 C) Oral (!) 122 18 100 %   Results for orders placed or performed during the hospital encounter of 07/29/21 (from the past 24 hour(s))  Fern Test     Status: None   Collection Time:  07/29/21 12:20 PM  Result Value Ref Range   POCT Fern Test Positive = ruptured amniotic membanes   CBC     Status: Abnormal   Collection Time: 07/29/21 12:22 PM  Result Value Ref Range   WBC 20.9 (H) 4.0 - 10.5 K/uL   RBC 3.52 (L) 3.87 - 5.11 MIL/uL   Hemoglobin 10.5 (L) 12.0 - 15.0 g/dL   HCT 95.6 (L) 38.7 - 56.4 %   MCV 87.2 80.0 - 100.0 fL   MCH 29.8 26.0 - 34.0 pg   MCHC 34.2 30.0 - 36.0 g/dL   RDW 33.2 95.1 - 88.4 %   Platelets 273 150 - 400 K/uL   nRBC 0.0 0.0 - 0.2 %  Type and screen     Status: None   Collection Time: 07/29/21 12:22 PM  Result Value Ref Range   ABO/RH(D) A POS    Antibody Screen NEG    Sample Expiration      08/01/2021,2359 Performed at [facility], [address], [place], [place] [address]    Assessment and Plan  --40 y.o. G3P0020 at 101w3d with mo/di twins --Grossly ruptured 10 or 1030 this morning --Baby A cephalic, Baby B breech --Dr. Cleatis Polka notified --Per Dr. Connye Burkitt, admit to L&D  Calvert Cantor, CNM 07/29/2021, 1:31 PM

## 2021-07-29 NOTE — Progress Notes (Signed)
In to re-evaluate patient. Her mother is present at bedside along with her husband, Onalee Hua. Neonatology has just been in the room to consult with patient. Patient was informed regarding centers who will resuscitate infants at 22 weeks [place], [place], and [facility]). Patient and her husband are undecided if they would be interested in this course of action. We discussed that I have spoken with Dr. Noralee Space, MFM as well as they have been performing her ultrasounds - plan for consult in 24-48 hours if delivery does not ensue. Currently, patient feels comfortable with her epidural. Counseled patient that she can take a break from the [facility] if desired. Contractions every 1-1.5 minutes on toco. Plan to continue epidural overnight and re-evaluate in the morning.  Steva Ready, DO

## 2021-07-29 NOTE — Progress Notes (Addendum)
Subjective:    Comfortable w/ epidural. Spouse present and supportive. Pt denies concerns. POC discussed to continue expectant management.   Objective:    VS: BP 128/86   Pulse (!) 115   Temp 98.6 F (37 C) (Oral)   Resp 20   LMP 02/16/2021   SpO2 100%  FHR A : baseline 150  FHR B: baseline 150  Toco: contractions every 1-3 minutes  Membranes: PROM x12 hrs VE deferred   Assessment/Plan:   40 y.o. G3P0020 [redacted]w[redacted]d Mono/Di twin gestation PROM  -defer VE to reduce risk of chorio Preterm labor   -NICU consult complete   -antenatal steroid dose x1 given @ 1219 07/29/21, will re-eval need for second dose tomorrow Continue expectant management of pre-viable twins Plan created w/ Drs. Connye Burkitt and Dillard   Roma Schanz MSN, CNM 07/29/2021 9:27 PM

## 2021-07-29 NOTE — Anesthesia Preprocedure Evaluation (Signed)
Anesthesia Evaluation  Patient identified by MRN, date of birth, ID band Patient awake    Reviewed: Allergy & Precautions, Patient's Chart, lab work & pertinent test results  Airway Mallampati: II  TM Distance: >3 FB Neck ROM: Full    Dental no notable dental hx.    Pulmonary asthma , Current Smoker,    Pulmonary exam normal breath sounds clear to auscultation       Cardiovascular negative cardio ROS Normal cardiovascular exam Rhythm:Regular Rate:Normal     Neuro/Psych  Headaches, negative psych ROS   GI/Hepatic negative GI ROS, Neg liver ROS,   Endo/Other  negative endocrine ROS  Renal/GU negative Renal ROS  negative genitourinary   Musculoskeletal negative musculoskeletal ROS (+)   Abdominal   Peds negative pediatric ROS (+)  Hematology  (+) Blood dyscrasia, anemia , hct 30.7, plt 273   Anesthesia Other Findings   Reproductive/Obstetrics (+) Pregnancy G3P0 twins, PPROM 22wks                             Anesthesia Physical Anesthesia Plan  ASA: 3  Anesthesia Plan: Epidural   Post-op Pain Management:    Induction:   PONV Risk Score and Plan: 2  Airway Management Planned: Natural Airway  Additional Equipment: None  Intra-op Plan:   Post-operative Plan:   Informed Consent: I have reviewed the patients History and Physical, chart, labs and discussed the procedure including the risks, benefits and alternatives for the proposed anesthesia with the patient or authorized representative who has indicated his/her understanding and acceptance.       Plan Discussed with:   Anesthesia Plan Comments:         Anesthesia Quick Evaluation

## 2021-07-29 NOTE — ED Triage Notes (Signed)
Pt  c/o increase back and hip since yesterday. Pt also endorses n/v.   Hx: miscarriages

## 2021-07-29 NOTE — H&P (Signed)
HPI: 40 y.o. G3P0020 @ [redacted]w[redacted]d estimated gestational age (as dated by [redacted]w[redacted]d ultrasound) presents for leakage of fluid and contractions. In MAU, found to be grossly ruptured, positive fern test, and visually 1-2cm dilation.  Leakage of fluid:  Yes Vaginal bleeding:  No Contractions:  Yes Fetal movement:  Yes x 2  Prenatal care has been provided by Dr. Katrinka Blazing. [facility])  ROS:  Denies fevers, chills, chest pain, visual changes, SOB, RUQ/epigastric pain, N/V, dysuria, hematuria, or sudden onset/worsening bilateral LE or facial edema.  Pregnancy complicated by: Monochorionic/Diamniotic twin gestation Low-lying placenta Advanced maternal age History of tobacco use (quit 7/9) Migraines without aura  Prenatal Transfer Tool  Maternal Diabetes: No Genetic Screening: Normal Maternal Ultrasounds/Referrals: Normal Fetal Ultrasounds or other Referrals:  Referred to Materal Fetal Medicine  Maternal Substance Abuse:  No Significant Maternal Medications:  None Significant Maternal Lab Results: Other:    Prenatal Labs Blood type:  A Positive Antibody screen:  Negative CBC:  H/H 13/38 Rubella: Immune RPR:  Non-reactive Hep B:  Negative Hep C:  Negative HIV:  Negative GC/CT:  Negative Glucola:  Not performed yet  Immunizations: Tdap: Has not yet received Flu: Received 07/28/10  OBHx:  OB History     Gravida  3   Para  0   Term  0   Preterm  0   AB  2   Living  0      SAB  2   IAB  0   Ectopic  0   Multiple      Live Births             PMHx:  See above Meds:  PNV Allergy:  No Known Allergies SurgHx: Right and left arm SocHx:   Denies Tobacco, ETOH, illicit drugs  O: BP 132/85 (BP Location: Right Arm)   Pulse (!) 118   Temp 98 F (36.7 C) (Oral)   Resp 20   LMP 02/16/2021   SpO2 100%  Gen. AAOx3, NAD CV.  RRR  Resp. CTAB, no wheezes/rales/rhonchi Abd. Gravid, soft, non-tender throughout, no rebound/guarding Extr.  No bilateral LE edema,  no calf tenderness bilaterally SVE: 6cm/100% effaced at 1241 by primary RN  Bedside US: Fetal heart rate x 2 noted, Fetus A cephalic, Twin B breech  Last [place]:   ----------------------------------------------------------------------  OBSTETRICS REPORT                       (Signed Final 07/18/2021 09:01 am) ---------------------------------------------------------------------- Patient Info  ID #:       [id number]                          D.O.B.:  Mar 19, 1981 (40 yrs)  Name:       Deanna Pena                    Visit Date: 07/18/2021 07:49 am ---------------------------------------------------------------------- Performed By  Attending:        Ma Rings MD         Ref. Address:     [facility]                                                             [address]  [address]                                                             [place], [place]                                                             [address]  Performed By:     Eden Lathe BS      Location[facility]                                 [facility]  Referred By:      Steva Ready ---------------------------------------------------------------------- Orders  #  Description                           Code        Ordered By  1  [place] MFM OB LIMITED                     88416.60    Noralee Space ----------------------------------------------------------------------  #  Order #                     Accession #                Episode #  1  [id number]                   [id number]                 [id number] ---------------------------------------------------------------------- Indications  Twin pregnancy, mono/di, second trimester      O30.032  [redacted] weeks gestation of pregnancy                 Z3A.20  Encounter for other antenatal screening        Z36.2  follow-up  Advanced maternal age multigravida 39,         O20.522  second trimester  Low risk NIPS ---------------------------------------------------------------------- Fetal Evaluation (Fetus A)  Num Of Fetuses:         2  Fetal Heart Rate(bpm):  153  Cardiac Activity:       Observed  Fetal Lie:              Lower Fetus  Presentation:           Cephalic  Placenta:               Anterior  P. Cord Insertion:      Visualized  Membrane Desc:      Dividing Membrane seen - Monochorionic  Amniotic Fluid  AFI FV:      Within normal limits                              Largest Pocket(cm)                              8. ---------------------------------------------------------------------- OB History  Gravidity:    4         Term:   0        Prem:   0        SAB:   3  TOP:          0       Ectopic:  0        Living: 0 ---------------------------------------------------------------------- Gestational Age (Fetus A)  LMP:           21w 5d        Date:  02/16/21                 EDD:   11/23/21  Best:          [name] 6d     Det. ByMarcella Dubs         EDD:   11/29/21                                      (04/17/21) ---------------------------------------------------------------------- Anatomy (Fetus A)  Ventricles:            Appears normal         Kidneys:                Appear normal  Stomach:               Appears normal, left   Bladder:                Appears normal                         sided ---------------------------------------------------------------------- Fetal Evaluation (Fetus B)  Num Of Fetuses:         2  Fetal Heart Rate(bpm):  164  Cardiac Activity:       Observed  Fetal Lie:              Upper Fetus  Presentation:           Breech  Placenta:               Anterior  P. Cord Insertion:      Marginal insertion  Membrane Desc:      Dividing Membrane seen - Monochorionic  Amniotic Fluid  AFI FV:       Within normal limits  Largest Pocket(cm)                              5.3 ---------------------------------------------------------------------- Gestational Age (Fetus B)  LMP:           21w 5d        Date:  02/16/21                 EDD:   11/23/21  Best:          [name] 6d     Det. By:  Marcella Dubs         EDD:   11/29/21                                      (04/17/21) ---------------------------------------------------------------------- Anatomy (Fetus B)  Stomach:               Appears normal, left   Bladder:                Appears normal                         sided  Kidneys:               Appear normal ---------------------------------------------------------------------- Cervix Uterus Adnexa  Cervix  Length:           3.72  cm.  Normal appearance by transabdominal scan.  Uterus  No abnormality visualized.  Right Ovary  Simple cyst, measuring 4.3 x 3.8 x 2.7 cm.  Left Ovary  Within normal limits.  Cul De Sac  No free fluid seen.  Adnexa  No abnormality visualized. ---------------------------------------------------------------------- Comments  This patient was seen for a limited ultrasound to screen for  twin to twin transfusion syndrome due to a monochorionic,  diamniotic twin gestation.  She denies any problems since  her last exam.  There were no signs of the twin to twin transfusion syndrome  (TTTS) noted today.  There was normal amniotic fluid noted around both fetuses.  Normal-appearing filled fetal bladders were noted in both twin  A and twin B.  Due to the monochorionic twin gestation, she was referred to  [facility] pediatric cardiology for a fetal echocardiogram.  She will return in 2 weeks for another growth ultrasound and  TTTS check. ----------------------------------------------------------------------                   Ma Rings, MD Electronically Signed Final Report   07/18/2021 09:01 am   Labs: see orders  A/P:  40  y.o. G3P0020 @ [redacted]w[redacted]d who presents for PPROM/PTL.  - Admit to L&D - Admit labs (CBC, T&S, COVID screen) - CEFM/Toco - Diet:  Clear liquids - IVF:  LR at 125cc/hour - VTE Prophylaxis:  SCDs - GBS Status:  Unknown - Presentation:  Twin A Cephalic, Twin B Breech - Pain control:  IV Fentanyl PRN - Anticipate progression to delivery  I discussed with the patient and her husband that, unfortunately, her babies are not viable. Labor is progressing rapidly and the babies will not survive outside of the womb. I counseled the patient that viability is considered at 23 weeks. Patient and her husband would like to speak with Neonatology regarding specifics. Emotional support provided.  Steva Ready, DO [phone] (office)

## 2021-07-29 NOTE — Anesthesia Procedure Notes (Signed)
Epidural Patient location during procedure: OB Start time: 07/29/2021 1:25 PM End time: 07/29/2021 1:36 PM  Staffing Anesthesiologist: Lannie Fields, DO Performed: anesthesiologist   Preanesthetic Checklist Completed: patient identified, IV checked, risks and benefits discussed, monitors and equipment checked, pre-op evaluation and timeout performed  Epidural Patient position: sitting Prep: DuraPrep and site prepped and draped Patient monitoring: continuous pulse ox, blood pressure, heart rate and cardiac monitor Approach: midline Location: L3-L4 Injection technique: LOR air  Needle:  Needle type: Tuohy  Needle gauge: 17 G Needle length: 9 cm Needle insertion depth: 6.5 cm Catheter type: closed end flexible Catheter size: 19 Gauge Catheter at skin depth: 12 cm Test dose: negative  Assessment Sensory level: T8 Events: blood not aspirated, injection not painful, no injection resistance, no paresthesia and negative IV test  Additional Notes Patient identified. Risks/Benefits/Options discussed with patient including but not limited to bleeding, infection, nerve damage, paralysis, failed block, incomplete pain control, headache, blood pressure changes, nausea, vomiting, reactions to medication both or allergic, itching and postpartum back pain. Confirmed with bedside nurse the patient's most recent platelet count. Confirmed with patient that they are not currently taking any anticoagulation, have any bleeding history or any family history of bleeding disorders. Patient expressed understanding and wished to proceed. All questions were answered. Sterile technique was used throughout the entire procedure. Please see nursing notes for vital signs. Test dose was given through epidural catheter and negative prior to continuing to dose epidural or start infusion. Warning signs of high block given to the patient including shortness of breath, tingling/numbness in hands, complete motor  block, or any concerning symptoms with instructions to call for help. Patient was given instructions on fall risk and not to get out of bed. All questions and concerns addressed with instructions to call with any issues or inadequate analgesia.  Reason for block:procedure for pain

## 2021-07-29 NOTE — ED Provider Notes (Addendum)
Emergency Medicine Provider Triage Evaluation Note  Deanna Pena , a 40 y.o. female  was evaluated in triage.  Pt complains of increasing back and hip pain for the past day and a half.  G3 0020, 22-week pregnant female with monochorionic, diamniotic twins followed by Dr. Connye Burkitt at [facility].  States that it started yesterday afternoon noticing back and hip pain.  Now has lower abdominal pain.  She has had nausea and vomiting and decreased p.o. intake.  She states that she had vaginal discharge about an hour ago.  She has it at the bedside in a plastic bag.  It appears mucousy and bloody.  States that the abdominal pain has become more frequent and intense.  Appears that they are having a regular intervals she is unable to tell me how far apart they are.  She has had 2 previous.  Pregnancies that ended up in miscarriages at 7 and 11 weeks.  Review of Systems  Positive: Abdominal pain, vaginal bleeding Negative: Fevers Physical Exam  BP 132/88 (BP Location: Left Arm)   Pulse (!) 122   Temp 99 F (37.2 C) (Oral)   Resp 18   LMP 02/16/2021   SpO2 100%  Gen:   Awake, alert and oriented, appears to be in some abdominal pain Resp:  Normal effort  MSK:   Moves extremities without difficulty  Other:  Obviously pregnant abdomen, not rigid.  Lower extremities without significant swelling.  No headaches.  Neurologically intact.  Medical Decision Making  Medically screening exam initiated at 11:21 AM.  Appropriate orders placed.  Dellanira Dillow was informed that the remainder of the evaluation will be completed by another provider, this initial triage assessment does not replace that evaluation, and the importance of remaining in the ED until their evaluation is complete.  With MAU APP provider who agrees patient is appropriate to come over to MAU.  She will be transferred now.   Cristopher Peru, PA-C 07/29/21 1131    Cristopher Peru, PA-C 07/29/21 1132    Terald Sleeper, MD 07/29/21  [phone]

## 2021-07-29 NOTE — ED Notes (Signed)
Report called to MAU. Transport called. 

## 2021-07-30 ENCOUNTER — Encounter (HOSPITAL_COMMUNITY): Payer: Self-pay | Admitting: Obstetrics and Gynecology

## 2021-07-30 LAB — RPR: RPR Ser Ql: NONREACTIVE

## 2021-07-30 MED ORDER — ONDANSETRON HCL 4 MG PO TABS: 4.0000 mg | ORAL_TABLET | ORAL | Status: DC | PRN

## 2021-07-30 MED ORDER — ONDANSETRON HCL 4 MG/2ML IJ SOLN: 4.0000 mg | INTRAMUSCULAR | Status: DC | PRN

## 2021-07-30 MED ORDER — ZOLPIDEM TARTRATE 5 MG PO TABS: 5.0000 mg | ORAL_TABLET | Freq: Every evening | ORAL | Status: DC | PRN

## 2021-07-30 MED ORDER — DIPHENHYDRAMINE HCL 25 MG PO CAPS: 25.0000 mg | ORAL_CAPSULE | Freq: Four times a day (QID) | ORAL | Status: DC | PRN

## 2021-07-30 MED ORDER — BENZOCAINE-MENTHOL 20-0.5 % EX AERO: 1.0000 "application " | INHALATION_SPRAY | CUTANEOUS | Status: DC | PRN

## 2021-07-30 MED ORDER — IBUPROFEN 600 MG PO TABS
600.0000 mg | ORAL_TABLET | Freq: Four times a day (QID) | ORAL | Status: DC
  Administered 2021-07-30 – 2021-07-31 (×5): 600 mg via ORAL
  Filled 2021-07-30 (×5): qty 1

## 2021-07-30 MED ORDER — IBUPROFEN 600 MG PO TABS: 600.0000 mg | ORAL_TABLET | Freq: Four times a day (QID) | ORAL | 3 refills | Status: AC

## 2021-07-30 MED ORDER — COCONUT OIL OIL: 1.0000 "application " | TOPICAL_OIL | Status: DC | PRN

## 2021-07-30 MED ORDER — OXYTOCIN-SODIUM CHLORIDE 30-0.9 UT/500ML-% IV SOLN
2.5000 [IU]/h | INTRAVENOUS | Status: DC
  Administered 2021-07-30: 2.5 [IU]/h via INTRAVENOUS

## 2021-07-30 MED ORDER — SENNOSIDES-DOCUSATE SODIUM 8.6-50 MG PO TABS
2.0000 | ORAL_TABLET | Freq: Every day | ORAL | Status: DC
  Administered 2021-07-31: 2 via ORAL
  Filled 2021-07-30: qty 2

## 2021-07-30 MED ORDER — SIMETHICONE 80 MG PO CHEW: 80.0000 mg | CHEWABLE_TABLET | ORAL | Status: DC | PRN

## 2021-07-30 MED ORDER — OXYTOCIN-SODIUM CHLORIDE 30-0.9 UT/500ML-% IV SOLN
INTRAVENOUS | Status: AC
  Administered 2021-07-30: 333 mL via INTRAVENOUS
  Filled 2021-07-30: qty 500

## 2021-07-30 MED ORDER — WITCH HAZEL-GLYCERIN EX PADS: 1.0000 "application " | MEDICATED_PAD | CUTANEOUS | Status: DC | PRN

## 2021-07-30 MED ORDER — ACETAMINOPHEN 325 MG PO TABS: 650.0000 mg | ORAL_TABLET | ORAL | Status: DC | PRN

## 2021-07-30 MED ORDER — DIBUCAINE (PERIANAL) 1 % EX OINT: 1.0000 "application " | TOPICAL_OINTMENT | CUTANEOUS | Status: DC | PRN

## 2021-07-30 MED ORDER — OXYTOCIN BOLUS FROM INFUSION: 333.0000 mL | Freq: Once | INTRAVENOUS | Status: AC

## 2021-07-30 NOTE — Discharge Instructions (Signed)
Please call the office if you experience any of the following:  Heavy vaginal bleeding (saturating more than 1 large pad in 2 hours) Fever (Temperature of 100.4 Farenheit or greater) Severe nausea/vomiting and inability to tolerate food by mouth Severe swelling or redness of each leg

## 2021-07-30 NOTE — Progress Notes (Signed)
Called to bedside by RN for vaginal bleeding. Pt perceives intermittent vaginal pressure. Normal bloody show noted. Labor expectations reviewed w/ pt and spouse and questions answered.   Dr. Normand Sloop updated and plan developed. Pt would prefer to delay delivery until Dr. Connye Burkitt returns at 0700.   Roma Schanz MSN, CNM 07/30/2021 6:07 AM

## 2021-07-30 NOTE — Progress Notes (Signed)
Subjective:    Reports large amount of fluid from vagina. RN present and changing saturated bedding. Denies feeling rectal/vaginal pressure.   Objective:    VS: BP 130/78   Pulse (!) 107   Temp 98.1 F (36.7 C) (Oral)   Resp 20   LMP 02/16/2021   SpO2 100%  FHR A: baseline 120 FHR B: baseline 120 Toco: contractions every 1-3 Membranes: PROM, copious clear fluid  Dilation: cannot appreciate cervix Effacement (%): 100 Station: -1 Presentation: Vertex Exam by:: Lizbeth Bark, CNM   Assessment/Plan:   40 y.o. G3P0020 [redacted]w[redacted]d Mono/Di twin gestation PROM Preterm labor    -vertex of Baby A palpated in the vagina Pt does not feel any pressure and requests conservative management. Pt will call when/if pressure is felt.   Dr. Normand Sloop notified of imminent birth  Pt request  Roma Schanz MSN, CNM 07/30/2021 4:56 AM

## 2021-07-30 NOTE — Anesthesia Postprocedure Evaluation (Signed)
Anesthesia Post Note  Patient: Deanna Pena  Procedure(s) Performed: AN AD HOC LABOR EPIDURAL     Patient location during evaluation: Mother Baby Anesthesia Type: Epidural Level of consciousness: awake and alert Pain management: pain level controlled Vital Signs Assessment: post-procedure vital signs reviewed and stable Respiratory status: spontaneous breathing, nonlabored ventilation and respiratory function stable Cardiovascular status: stable Postop Assessment: no headache, no backache, epidural receding, no apparent nausea or vomiting, patient able to bend at knees, adequate PO intake and able to ambulate Anesthetic complications: no   No notable events documented.  Last Vitals:  Vitals:   07/30/21 1355 07/30/21 1454  BP: 122/85 122/83  Pulse: (!) 102 96  Resp: 17 18  Temp: 36.9 C 36.8 C  SpO2: 100% 100%    Last Pain:  Vitals:   07/30/21 1454  TempSrc: Oral  PainSc:    Pain Goal:                   [place]

## 2021-07-31 LAB — CBC
HCT: 23.6 % — ABNORMAL LOW (ref 36.0–46.0)
Hemoglobin: 8 g/dL — ABNORMAL LOW (ref 12.0–15.0)
MCH: 30 pg (ref 26.0–34.0)
MCHC: 33.9 g/dL (ref 30.0–36.0)
MCV: 88.4 fL (ref 80.0–100.0)
Platelets: 229 10*3/uL (ref 150–400)
RBC: 2.67 MIL/uL — ABNORMAL LOW (ref 3.87–5.11)
RDW: 13.2 % (ref 11.5–15.5)
WBC: 18.8 10*3/uL — ABNORMAL HIGH (ref 4.0–10.5)
nRBC: 0 % (ref 0.0–0.2)

## 2021-07-31 MED ORDER — FERROUS SULFATE 325 (65 FE) MG PO TABS: 325.0000 mg | ORAL_TABLET | Freq: Every day | ORAL | 1 refills | Status: AC

## 2021-07-31 NOTE — Discharge Summary (Signed)
Postpartum Discharge Summary  Date of Service updated 07/31/2021     Patient Name: Deanna Pena DOB: [birth year] MRN: [id number]  Date of admission: 07/29/2021 Delivery date:   Ahonesty, Woodfin [[id number]]  07/30/2021    Trannie, Bardales [[id number]]  07/30/2021  Delivering provider:    Zyair, Rhein [[id number]]  [place], MELISSA    Retal, Tonkinson [[id number]]  Plessis, MELISSA  Date of discharge: 07/31/2021  Admitting diagnosis: Preterm premature rupture of membranes [O42.919] Preterm labor [O60.00] Intrauterine pregnancy: [redacted]w[redacted]d    Secondary diagnosis:  Active Problems:   Preterm premature rupture of membranes   Preterm labor  Additional problems: None    Discharge diagnosis: Preterm Pregnancy Delivered                                              Post partum procedures: None Augmentation: N/A Complications: None  Hospital course: Onset of Labor With Vaginal Delivery      40y.o. yo [id number] 267w4das admitted in Active Labor on 07/29/2021. Patient had an uncomplicated labor course as follows:  Membrane Rupture Time/Date:    GaAdelie, Croswell0[[id number]]10:30 AM    GaIvor Costa0[[id number]]4:24 AM ,   GaMoani, Weipertirl AlFariha0[[id number]]07/29/2021    GaLataysha, Vohra0[[id number]]07/30/2021   Delivery Method:   GaAnder Slade0[[id number]]Vaginal, Spontaneous    GaKilie, Rund0[[id number]]Vaginal, Spontaneous  Episiotomy:    GaDeclan, Adamson0[[id number]]None    GaNoella, Kipnis0[[id number]]None  Lacerations:     GaTerran, Hollenkamp0[[id number]]None    GaAllisa, Einspahr0[[id number]]None  Patient had an uncomplicated postpartum course.  She is ambulating, tolerating a regular diet, passing flatus, and urinating well. Patient is discharged home in stable condition on 07/31/21.  Newborn Data: Birth date:   GaNizhoni, Parlow0[[id number]]07/30/2021    GaMattisen, Pohlmann0[[id number]] 07/30/2021  Birth time:   GaDorissa, Stinnette0[[id number]]7:30 AM    GaIvor Costa0[[id number]]7:40 AM  Gender:   GaOluwasemilore, Pascuzzi0[[id number]]Female    GaPatriciann, Becht0[[id number]]Female  Living status:   GaCaisley, Baxendale0[[id number]]Living    GaAroura, Vasudevan0[[id number]]Living  Apgars:   GaKeelin, Neville0[[id number]]2    GaXiomara, Sevillano0[[id number]]1 ,   GaHoltGirl AlRajanee0[[id number]]1    GaJasemine, Nawaz0[[id number]]1  Weight:   GaAtasha, [place]0[[id number]]  850    GaKiyra, Slaubaugh0[[id number]]380 g   Magnesium Sulfate received: No BMZ received: Yes Rhophylac:N/A MMR:N/A T-DaP: None Flu: N/A Transfusion:No  Physical exam  Vitals:   07/30/21 2003 07/30/21 2336 07/31/21 0342 07/31/21 0803  BP: 124/65 116/66 112/67 122/75  Pulse: 100 87 85 77  Resp: _0 Temp: 98.3 F (36.8 C) 98.1 F (36.7 C) 97.9 F (36.6 C) 98 F (36.7 C)  TempSrc: Oral Oral Oral Oral  SpO2: 99% 94% 98% 98%  Weight:      Height:       General: alert, cooperative, and no distress Lochia: appropriate Uterine Fundus: firm Incision: N/A DVT Evaluation: No evidence of DVT  seen on physical exam. Labs: Lab Results  Component Value Date   WBC 18.8 (H) 07/31/2021   HGB 8.0 (L) 07/31/2021   HCT 23.6 (L) 07/31/2021   MCV 88.4 07/31/2021   PLT 229 07/31/2021   CMP Latest Ref Rng & Units 10/24/2009  Glucose 70 - 99 mg/dL 98  BUN 6 - 23 mg/dL 11  Creatinine 0.4 - 1.2 mg/dL 0.64  Sodium 135 - 145 mEq/L 139  Potassium 3.5 - 5.1 mEq/L 3.3(L)  Chloride 96 - 112 mEq/L 106  CO2 19 - 32 mEq/L 23  Calcium 8.4 - 10.5 mg/dL 9.4   Edinburgh Score: No flowsheet data found.    After visit meds:  Allergies as of 07/31/2021   No Known Allergies      Medication List     STOP taking these medications    aspirin EC 81 MG tablet   prenatal multivitamin Tabs tablet       TAKE these medications    cetirizine 10 MG  tablet Commonly known as: ZYRTEC Take 10 mg by mouth daily.   ferrous sulfate 325 (65 FE) MG tablet Commonly known as: FerrouSul Take 1 tablet (325 mg total) by mouth daily with breakfast.   ibuprofen 600 MG tablet Commonly known as: ADVIL Take 1 tablet (600 mg total) by mouth every 6 (six) hours.   SYMBICORT IN Inhale 1 puff into the lungs daily.         Discharge home in stable condition Infant Feeding:  None previable delivery  Infant Disposition:morgue Discharge instruction: per After Visit Summary and Postpartum booklet. Activity: Advance as tolerated. Pelvic rest for 6 weeks.  Diet: routine diet Anticipated Birth Control: Unsure Postpartum Appointment:2 weeks Additional Postpartum F/U:  None Future Appointments:No future appointments. Follow up Visit:  Follow-up Information     Drema Dallas, DO Follow up in 2 week(s).   Specialty: Obstetrics and Gynecology Why: Our office will arrange a 2 week follow-up for you. Contact information: [address] [place] [address] [phone]                     07/31/2021 Christophe Louis, MD

## 2021-07-31 NOTE — Plan of Care (Signed)
  Problem: Education: Goal: Knowledge of the prescribed therapeutic regimen will improve Outcome: Adequate for Discharge   

## 2021-07-31 NOTE — Plan of Care (Signed)
  Problem: Education: Goal: Knowledge of the prescribed therapeutic regimen will improve 07/31/2021 1226 by Pat Patrick, RN Outcome: Adequate for Discharge 07/31/2021 0856 by Pat Patrick, RN Outcome: Adequate for Discharge

## 2021-07-31 NOTE — Progress Notes (Signed)
Pt d/c with husband  will return to get molds  birth copies will be mailed to pt

## 2021-08-01 ENCOUNTER — Ambulatory Visit: Payer: BC Managed Care – PPO

## 2021-08-04 LAB — SURGICAL PATHOLOGY

## 2021-08-12 ENCOUNTER — Telehealth (HOSPITAL_COMMUNITY): Payer: Self-pay | Admitting: *Deleted

## 2021-08-12 NOTE — Telephone Encounter (Signed)
Attempted hospital discharge follow-up call. Left message for patient to return RN call. Deforest Hoyles, RN, 08/12/21, [phone]

## 2021-08-15 ENCOUNTER — Ambulatory Visit: Payer: BC Managed Care – PPO

## 2021-08-15 ENCOUNTER — Other Ambulatory Visit: Payer: Self-pay

## 2021-08-29 ENCOUNTER — Ambulatory Visit: Payer: BC Managed Care – PPO

## 2021-11-21 DIAGNOSIS — H0019 Chalazion unspecified eye, unspecified eyelid: Secondary | ICD-10-CM | POA: Diagnosis not present

## 2021-11-26 DIAGNOSIS — N83201 Unspecified ovarian cyst, right side: Secondary | ICD-10-CM | POA: Diagnosis not present

## 2021-12-25 DIAGNOSIS — U071 COVID-19: Secondary | ICD-10-CM | POA: Diagnosis not present

## 2021-12-25 DIAGNOSIS — R509 Fever, unspecified: Secondary | ICD-10-CM | POA: Diagnosis not present

## 2022-04-08 DIAGNOSIS — D225 Melanocytic nevi of trunk: Secondary | ICD-10-CM | POA: Diagnosis not present

## 2022-04-08 DIAGNOSIS — D2261 Melanocytic nevi of right upper limb, including shoulder: Secondary | ICD-10-CM | POA: Diagnosis not present

## 2022-04-08 DIAGNOSIS — L7211 Pilar cyst: Secondary | ICD-10-CM | POA: Diagnosis not present

## 2022-04-08 DIAGNOSIS — D2362 Other benign neoplasm of skin of left upper limb, including shoulder: Secondary | ICD-10-CM | POA: Diagnosis not present

## 2022-04-27 DIAGNOSIS — F17210 Nicotine dependence, cigarettes, uncomplicated: Secondary | ICD-10-CM | POA: Diagnosis not present

## 2022-04-27 DIAGNOSIS — J4520 Mild intermittent asthma, uncomplicated: Secondary | ICD-10-CM | POA: Diagnosis not present

## 2022-04-27 DIAGNOSIS — B360 Pityriasis versicolor: Secondary | ICD-10-CM | POA: Diagnosis not present

## 2022-04-27 DIAGNOSIS — Z0000 Encounter for general adult medical examination without abnormal findings: Secondary | ICD-10-CM | POA: Diagnosis not present
